# Patient Record
Sex: Male | Born: 1975 | Hispanic: Yes | Marital: Single | Smoking: Never smoker
Health system: Southern US, Community
[De-identification: ages and names within clinical notes are randomized; demographics above are authoritative.]

## PROBLEM LIST (undated history)

## (undated) DIAGNOSIS — Z789 Other specified health status: Secondary | ICD-10-CM

## (undated) DIAGNOSIS — J302 Other seasonal allergic rhinitis: Secondary | ICD-10-CM

## (undated) HISTORY — PX: NO PAST SURGERIES: SHX2092

---

## 2014-08-23 ENCOUNTER — Other Ambulatory Visit (HOSPITAL_COMMUNITY): Payer: Self-pay

## 2014-08-23 NOTE — H&P (Signed)
  Martin LevineDaniel Thompson is an 39 y.o. male.    Chief Complaint: left shoulder pain  HPI: Pt is a 39 y.o. male complaining of left shoulder pain for 2 weeks. Pt fell while working about 12 feet onto left shoulder and ribs. Pain had continually increased since the beginning. X-rays in the clinic show grade III AC separation. Pt has tried various conservative treatments which have failed to alleviate their symptoms, including sling and rest. Various options are discussed with the patient. Risks, benefits and expectations were discussed with the patient. Patient understand the risks, benefits and expectations and wishes to proceed with surgery.   PCP:  No primary care provider on file.  D/C Plans:  Home   PMH: No past medical history on file.  PSH: No past surgical history on file.  Social History:  has no tobacco, alcohol, and drug history on file.  Allergies:  Allergies not on file  Medications: No current facility-administered medications for this encounter.   No current outpatient prescriptions on file.    No results found for this or any previous visit (from the past 48 hour(s)). No results found.  ROS: Pain with rom of the left upper extremity  Physical Exam:  Alert and oriented 39 y.o. male in no acute distress Cranial nerves 2-12 intact Cervical spine: full rom with no tenderness, nv intact distally Chest: active breath sounds bilaterally, no wheeze rhonchi or rales Heart: regular rate and rhythm, no murmur Abd: non tender non distended with active bowel sounds Hip is stable with rom  Left shoulder with obvious deformity and left AC separation nv intact distally No rashes or edema  Assessment/Plan Assessment: left shoulder AC separation  Plan: Patient will undergo a left shoulder coracoclavicular reconstruction by Dr. Ranell [facility]Norris [facility]. Risks benefits and expectations were discussed with the patient. Patient understand risks, benefits and expectations and wishes to  proceed.

## 2014-08-25 ENCOUNTER — Encounter (HOSPITAL_COMMUNITY): Payer: Self-pay | Admitting: *Deleted

## 2014-08-25 MED ORDER — CEFAZOLIN SODIUM-DEXTROSE 2-3 GM-% IV SOLR
2.0000 g | INTRAVENOUS | Status: AC
  Administered 2014-08-26: 2 g via INTRAVENOUS
  Filled 2014-08-25: qty 50

## 2014-08-25 NOTE — Progress Notes (Signed)
Spoke with pt's friend Martin RilingCris Thompson, who speaks [place]. Pt requested that she be called and given pre-op instructions. She was able to verify his allergies, meds and some of his medical/surgical history. Will need to ask more questions to pt day of surgery. Interpreter has been requested.

## 2014-08-26 ENCOUNTER — Observation Stay (HOSPITAL_COMMUNITY): Payer: Self-pay

## 2014-08-26 ENCOUNTER — Other Ambulatory Visit (HOSPITAL_COMMUNITY): Payer: Self-pay | Admitting: Physician Assistant

## 2014-08-26 ENCOUNTER — Encounter (HOSPITAL_COMMUNITY): Payer: Self-pay | Admitting: *Deleted

## 2014-08-26 ENCOUNTER — Ambulatory Visit (HOSPITAL_COMMUNITY): Payer: Self-pay | Admitting: Anesthesiology

## 2014-08-26 ENCOUNTER — Encounter (HOSPITAL_COMMUNITY)
Admission: RE | Disposition: A | Discharge status: Home / Self Care | Payer: Self-pay | Source: Ambulatory Visit | Attending: Orthopedic Surgery

## 2014-08-26 ENCOUNTER — Observation Stay (HOSPITAL_COMMUNITY)
Admission: RE | Admit: 2014-08-26 | Discharge: 2014-08-27 | Disposition: A | Discharge status: Home / Self Care | Payer: Self-pay | Source: Ambulatory Visit | Attending: Orthopedic Surgery | Admitting: Orthopedic Surgery

## 2014-08-26 DIAGNOSIS — M19012 Primary osteoarthritis, left shoulder: Secondary | ICD-10-CM | POA: Insufficient documentation

## 2014-08-26 DIAGNOSIS — S43432A Superior glenoid labrum lesion of left shoulder, initial encounter: Secondary | ICD-10-CM | POA: Insufficient documentation

## 2014-08-26 DIAGNOSIS — W1789XA Other fall from one level to another, initial encounter: Secondary | ICD-10-CM | POA: Insufficient documentation

## 2014-08-26 DIAGNOSIS — S43102A Unspecified dislocation of left acromioclavicular joint, initial encounter: Principal | ICD-10-CM | POA: Insufficient documentation

## 2014-08-26 DIAGNOSIS — S46119A Strain of muscle, fascia and tendon of long head of biceps, unspecified arm, initial encounter: Secondary | ICD-10-CM | POA: Diagnosis present

## 2014-08-26 DIAGNOSIS — Y929 Unspecified place or not applicable: Secondary | ICD-10-CM | POA: Insufficient documentation

## 2014-08-26 DIAGNOSIS — S4382XA Sprain of other specified parts of left shoulder girdle, initial encounter: Secondary | ICD-10-CM

## 2014-08-26 HISTORY — PX: RECONSTRUCTION OF CORACOCLAVICULAR LIGAMENT: SHX6045

## 2014-08-26 HISTORY — DX: Other specified health status: Z78.9

## 2014-08-26 HISTORY — DX: Other seasonal allergic rhinitis: J30.2

## 2014-08-26 SURGERY — RECONSTRUCTION, LIGAMENT, CORACOCLAVICULAR
Anesthesia: General | Site: Shoulder | Laterality: Left

## 2014-08-26 MED ORDER — ONDANSETRON HCL 4 MG/2ML IJ SOLN
INTRAMUSCULAR | Status: DC | PRN
  Administered 2014-08-26: 4 mg via INTRAVENOUS

## 2014-08-26 MED ORDER — METOCLOPRAMIDE HCL 5 MG/ML IJ SOLN: 5.0000 mg | Freq: Three times a day (TID) | INTRAMUSCULAR | Status: DC | PRN

## 2014-08-26 MED ORDER — DEXAMETHASONE SODIUM PHOSPHATE 4 MG/ML IJ SOLN
INTRAMUSCULAR | Status: DC | PRN
  Administered 2014-08-26: 8 mg via INTRAVENOUS

## 2014-08-26 MED ORDER — OXYCODONE-ACETAMINOPHEN 5-325 MG PO TABS: 1.0000 | ORAL_TABLET | ORAL | Status: AC | PRN

## 2014-08-26 MED ORDER — LIDOCAINE HCL (CARDIAC) 20 MG/ML IV SOLN
INTRAVENOUS | Status: DC | PRN
  Administered 2014-08-26: 100 mg via INTRAVENOUS

## 2014-08-26 MED ORDER — ONDANSETRON HCL 4 MG PO TABS: 4.0000 mg | ORAL_TABLET | Freq: Four times a day (QID) | ORAL | Status: DC | PRN

## 2014-08-26 MED ORDER — FENTANYL CITRATE 0.05 MG/ML IJ SOLN
INTRAMUSCULAR | Status: AC
  Filled 2014-08-26: qty 5

## 2014-08-26 MED ORDER — FENTANYL CITRATE 0.05 MG/ML IJ SOLN
INTRAMUSCULAR | Status: DC | PRN
  Administered 2014-08-26: 100 ug via INTRAVENOUS
  Administered 2014-08-26 (×3): 50 ug via INTRAVENOUS

## 2014-08-26 MED ORDER — BUPIVACAINE-EPINEPHRINE (PF) 0.25% -1:200000 IJ SOLN
INTRAMUSCULAR | Status: AC
  Filled 2014-08-26: qty 30

## 2014-08-26 MED ORDER — BISACODYL 10 MG RE SUPP: 10.0000 mg | Freq: Every day | RECTAL | Status: DC | PRN

## 2014-08-26 MED ORDER — DEXAMETHASONE SODIUM PHOSPHATE 4 MG/ML IJ SOLN
INTRAMUSCULAR | Status: AC
  Filled 2014-08-26: qty 2

## 2014-08-26 MED ORDER — LIDOCAINE HCL (CARDIAC) 20 MG/ML IV SOLN
INTRAVENOUS | Status: AC
  Filled 2014-08-26: qty 5

## 2014-08-26 MED ORDER — ACETAMINOPHEN 325 MG PO TABS: 650.0000 mg | ORAL_TABLET | Freq: Four times a day (QID) | ORAL | Status: DC | PRN

## 2014-08-26 MED ORDER — METHOCARBAMOL 500 MG PO TABS
500.0000 mg | ORAL_TABLET | Freq: Four times a day (QID) | ORAL | Status: DC | PRN
  Administered 2014-08-27: 500 mg via ORAL
  Filled 2014-08-26 (×2): qty 1

## 2014-08-26 MED ORDER — NEOSTIGMINE METHYLSULFATE 10 MG/10ML IV SOLN
INTRAVENOUS | Status: DC | PRN
Start: 2014-08-26 — End: 2014-08-26
  Administered 2014-08-26: 4 mg via INTRAVENOUS

## 2014-08-26 MED ORDER — PHENYLEPHRINE HCL 10 MG/ML IJ SOLN
INTRAMUSCULAR | Status: DC | PRN
  Administered 2014-08-26: 80 ug via INTRAVENOUS
  Administered 2014-08-26: 120 ug via INTRAVENOUS
  Administered 2014-08-26 (×5): 80 ug via INTRAVENOUS

## 2014-08-26 MED ORDER — FENTANYL CITRATE 0.05 MG/ML IJ SOLN
INTRAMUSCULAR | Status: AC
  Administered 2014-08-26: 50 ug
  Filled 2014-08-26: qty 2

## 2014-08-26 MED ORDER — BUPIVACAINE-EPINEPHRINE (PF) 0.5% -1:200000 IJ SOLN
INTRAMUSCULAR | Status: DC | PRN
  Administered 2014-08-26: 20 mL via PERINEURAL

## 2014-08-26 MED ORDER — FENTANYL CITRATE 0.05 MG/ML IJ SOLN
INTRAMUSCULAR | Status: AC
  Filled 2014-08-26: qty 2

## 2014-08-26 MED ORDER — LACTATED RINGERS IV SOLN
INTRAVENOUS | Status: DC | PRN
  Administered 2014-08-26 (×2): via INTRAVENOUS

## 2014-08-26 MED ORDER — PHENYLEPHRINE 40 MCG/ML (10ML) SYRINGE FOR IV PUSH (FOR BLOOD PRESSURE SUPPORT)
PREFILLED_SYRINGE | INTRAVENOUS | Status: AC
  Filled 2014-08-26: qty 20

## 2014-08-26 MED ORDER — PHENOL 1.4 % MT LIQD
1.0000 | OROMUCOSAL | Status: DC | PRN
Start: 2014-08-26 — End: 2014-08-27

## 2014-08-26 MED ORDER — PROMETHAZINE HCL 25 MG/ML IJ SOLN: 6.2500 mg | INTRAMUSCULAR | Status: DC | PRN

## 2014-08-26 MED ORDER — NEOSTIGMINE METHYLSULFATE 10 MG/10ML IV SOLN
INTRAVENOUS | Status: AC
  Filled 2014-08-26: qty 1

## 2014-08-26 MED ORDER — FENTANYL CITRATE 0.05 MG/ML IJ SOLN
25.0000 ug | INTRAMUSCULAR | Status: DC | PRN
  Administered 2014-08-26 (×2): 50 ug via INTRAVENOUS

## 2014-08-26 MED ORDER — BUPIVACAINE-EPINEPHRINE 0.25% -1:200000 IJ SOLN
INTRAMUSCULAR | Status: DC | PRN
  Administered 2014-08-26: 8 mL

## 2014-08-26 MED ORDER — HYDROMORPHONE HCL 1 MG/ML IJ SOLN: 1.0000 mg | INTRAMUSCULAR | Status: DC | PRN

## 2014-08-26 MED ORDER — MENTHOL 3 MG MT LOZG: 1.0000 | LOZENGE | OROMUCOSAL | Status: DC | PRN

## 2014-08-26 MED ORDER — ROCURONIUM BROMIDE 50 MG/5ML IV SOLN
INTRAVENOUS | Status: AC
  Filled 2014-08-26: qty 1

## 2014-08-26 MED ORDER — GLYCOPYRROLATE 0.2 MG/ML IJ SOLN
INTRAMUSCULAR | Status: AC
  Filled 2014-08-26: qty 4

## 2014-08-26 MED ORDER — MIDAZOLAM HCL 5 MG/5ML IJ SOLN
INTRAMUSCULAR | Status: DC | PRN
  Administered 2014-08-26 (×2): 1 mg via INTRAVENOUS

## 2014-08-26 MED ORDER — MIDAZOLAM HCL 2 MG/2ML IJ SOLN
INTRAMUSCULAR | Status: AC
Start: 2014-08-26 — End: 2014-08-26
  Administered 2014-08-26: 1 mg
  Filled 2014-08-26: qty 2

## 2014-08-26 MED ORDER — POLYETHYLENE GLYCOL 3350 17 G PO PACK: 17.0000 g | PACK | Freq: Every day | ORAL | Status: DC | PRN

## 2014-08-26 MED ORDER — GLYCOPYRROLATE 0.2 MG/ML IJ SOLN
INTRAMUSCULAR | Status: AC
  Filled 2014-08-26: qty 2

## 2014-08-26 MED ORDER — PHENYLEPHRINE HCL 10 MG/ML IJ SOLN
10.0000 mg | INTRAVENOUS | Status: DC | PRN
  Administered 2014-08-26: 40 ug/min via INTRAVENOUS

## 2014-08-26 MED ORDER — ROCURONIUM BROMIDE 100 MG/10ML IV SOLN
INTRAVENOUS | Status: DC | PRN
  Administered 2014-08-26: 10 mg via INTRAVENOUS
  Administered 2014-08-26: 40 mg via INTRAVENOUS

## 2014-08-26 MED ORDER — ONDANSETRON HCL 4 MG/2ML IJ SOLN
INTRAMUSCULAR | Status: AC
  Filled 2014-08-26: qty 2

## 2014-08-26 MED ORDER — CEFAZOLIN SODIUM-DEXTROSE 2-3 GM-% IV SOLR
2.0000 g | Freq: Four times a day (QID) | INTRAVENOUS | Status: AC
  Administered 2014-08-26 – 2014-08-27 (×3): 2 g via INTRAVENOUS
  Filled 2014-08-26 (×3): qty 50

## 2014-08-26 MED ORDER — PROPOFOL 10 MG/ML IV BOLUS
INTRAVENOUS | Status: AC
  Filled 2014-08-26: qty 20

## 2014-08-26 MED ORDER — MIDAZOLAM HCL 2 MG/2ML IJ SOLN
INTRAMUSCULAR | Status: AC
  Filled 2014-08-26: qty 2

## 2014-08-26 MED ORDER — LACTATED RINGERS IV SOLN
INTRAVENOUS | Status: DC
  Administered 2014-08-26: 15:00:00 via INTRAVENOUS

## 2014-08-26 MED ORDER — GLYCOPYRROLATE 0.2 MG/ML IJ SOLN
INTRAMUSCULAR | Status: DC | PRN
  Administered 2014-08-26: 0.4 mg via INTRAVENOUS

## 2014-08-26 MED ORDER — MEPERIDINE HCL 25 MG/ML IJ SOLN: 6.2500 mg | INTRAMUSCULAR | Status: DC | PRN

## 2014-08-26 MED ORDER — METOCLOPRAMIDE HCL 10 MG PO TABS: 5.0000 mg | ORAL_TABLET | Freq: Three times a day (TID) | ORAL | Status: DC | PRN

## 2014-08-26 MED ORDER — SODIUM CHLORIDE 0.9 % IV SOLN
INTRAVENOUS | Status: DC
  Administered 2014-08-27: 07:00:00 via INTRAVENOUS

## 2014-08-26 MED ORDER — DEXTROSE 5 % IV SOLN
500.0000 mg | Freq: Four times a day (QID) | INTRAVENOUS | Status: DC | PRN
  Filled 2014-08-26: qty 5

## 2014-08-26 MED ORDER — ACETAMINOPHEN 650 MG RE SUPP: 650.0000 mg | Freq: Four times a day (QID) | RECTAL | Status: DC | PRN

## 2014-08-26 MED ORDER — OXYCODONE-ACETAMINOPHEN 5-325 MG PO TABS
1.0000 | ORAL_TABLET | ORAL | Status: DC | PRN
  Administered 2014-08-26 – 2014-08-27 (×3): 2 via ORAL
  Filled 2014-08-26 (×3): qty 2

## 2014-08-26 MED ORDER — ONDANSETRON HCL 4 MG/2ML IJ SOLN: 4.0000 mg | Freq: Four times a day (QID) | INTRAMUSCULAR | Status: DC | PRN

## 2014-08-26 MED ORDER — PROPOFOL 10 MG/ML IV BOLUS
INTRAVENOUS | Status: DC | PRN
  Administered 2014-08-26: 200 mg via INTRAVENOUS

## 2014-08-26 MED ORDER — METHOCARBAMOL 500 MG PO TABS: 500.0000 mg | ORAL_TABLET | Freq: Three times a day (TID) | ORAL | Status: AC | PRN

## 2014-08-26 SURGICAL SUPPLY — 64 items
ANCHOR JUGGERKNOT W/DRL 2/1.45 (Orthopedic Implant) ×3 IMPLANT
BIT DRILL 7/64X5 DISP (BIT) IMPLANT
BLADE AVERAGE 25MMX9MM (BLADE) ×1
BLADE AVERAGE 25X9 (BLADE) ×2 IMPLANT
BLADE SAW SGTL 81X20 HD (BLADE) ×3 IMPLANT
BNDG COHESIVE 4X5 TAN STRL (GAUZE/BANDAGES/DRESSINGS) ×3 IMPLANT
CLEANER TIP ELECTROSURG 2X2 (MISCELLANEOUS) ×3 IMPLANT
CLOSURE STERI-STRIP 1/2X4 (GAUZE/BANDAGES/DRESSINGS) ×1
CLOSURE WOUND 1/2 X4 (GAUZE/BANDAGES/DRESSINGS) ×1
CLSR STERI-STRIP ANTIMIC 1/2X4 (GAUZE/BANDAGES/DRESSINGS) ×2 IMPLANT
COVER SURGICAL LIGHT HANDLE (MISCELLANEOUS) ×3 IMPLANT
DECANTER SPIKE VIAL GLASS SM (MISCELLANEOUS) IMPLANT
DRAPE INCISE IOBAN 66X45 STRL (DRAPES) ×3 IMPLANT
DRAPE ORTHO SPLIT 77X108 STRL (DRAPES) ×2
DRAPE SURG ORHT 6 SPLT 77X108 (DRAPES) ×1 IMPLANT
DRAPE U-SHAPE 47X51 STRL (DRAPES) ×6 IMPLANT
DRSG ADAPTIC 3X8 NADH LF (GAUZE/BANDAGES/DRESSINGS) ×3 IMPLANT
DRSG EMULSION OIL 3X3 NADH (GAUZE/BANDAGES/DRESSINGS) ×3 IMPLANT
DRSG PAD ABDOMINAL 8X10 ST (GAUZE/BANDAGES/DRESSINGS) ×3 IMPLANT
DURAPREP 26ML APPLICATOR (WOUND CARE) ×3 IMPLANT
ELECT REM PT RETURN 9FT ADLT (ELECTROSURGICAL) ×3
ELECTRODE REM PT RTRN 9FT ADLT (ELECTROSURGICAL) ×1 IMPLANT
FACESHIELD WRAPAROUND (MASK) ×6 IMPLANT
FIBERSTICK 2 (SUTURE) ×6 IMPLANT
GAUZE SPONGE 4X4 12PLY STRL (GAUZE/BANDAGES/DRESSINGS) ×3 IMPLANT
GLOVE BIOGEL PI ORTHO PRO 7.5 (GLOVE) ×2
GLOVE BIOGEL PI ORTHO PRO SZ8 (GLOVE) ×2
GLOVE ORTHO TXT STRL SZ7.5 (GLOVE) ×3 IMPLANT
GLOVE PI ORTHO PRO STRL 7.5 (GLOVE) ×1 IMPLANT
GLOVE PI ORTHO PRO STRL SZ8 (GLOVE) ×1 IMPLANT
GLOVE SURG ORTHO 8.5 STRL (GLOVE) ×3 IMPLANT
GOWN STRL REUS W/ TWL LRG LVL3 (GOWN DISPOSABLE) ×1 IMPLANT
GOWN STRL REUS W/ TWL XL LVL3 (GOWN DISPOSABLE) ×2 IMPLANT
GOWN STRL REUS W/TWL LRG LVL3 (GOWN DISPOSABLE) ×2
GOWN STRL REUS W/TWL XL LVL3 (GOWN DISPOSABLE) ×4
KIT AC JOINT DISP (KITS) ×3 IMPLANT
KIT BASIN OR (CUSTOM PROCEDURE TRAY) ×3 IMPLANT
KIT BIO-TENODESIS 3X8 DISP (MISCELLANEOUS) ×4
KIT INSRT BABSR STRL DISP BTN (MISCELLANEOUS) ×2 IMPLANT
KIT ROOM TURNOVER OR (KITS) ×3 IMPLANT
MANIFOLD NEPTUNE II (INSTRUMENTS) ×3 IMPLANT
NDL SUT .5 MAYO 1.404X.05X (NEEDLE) ×1 IMPLANT
NDL SUT 6 .5 CRC .975X.05 MAYO (NEEDLE) ×1 IMPLANT
NEEDLE HYPO 25GX1X1/2 BEV (NEEDLE) ×3 IMPLANT
NEEDLE MAYO TAPER (NEEDLE) ×4
NS IRRIG 1000ML POUR BTL (IV SOLUTION) ×3 IMPLANT
PACK ORTHO EXTREMITY (CUSTOM PROCEDURE TRAY) ×3 IMPLANT
PACK SHOULDER (CUSTOM PROCEDURE TRAY) ×3 IMPLANT
PAD ARMBOARD 7.5X6 YLW CONV (MISCELLANEOUS) ×6 IMPLANT
PASSER SUT SWANSON 36MM LOOP (INSTRUMENTS) ×3 IMPLANT
SPONGE LAP 4X18 X RAY DECT (DISPOSABLE) ×6 IMPLANT
STOCKINETTE IMPERVIOUS 9X36 MD (GAUZE/BANDAGES/DRESSINGS) ×3 IMPLANT
STRIP CLOSURE SKIN 1/2X4 (GAUZE/BANDAGES/DRESSINGS) ×2 IMPLANT
SUT MNCRL AB 3-0 PS2 18 (SUTURE) ×3 IMPLANT
SUT VIC AB 2-0 CT1 27 (SUTURE) ×2
SUT VIC AB 2-0 CT1 TAPERPNT 27 (SUTURE) ×1 IMPLANT
SYR CONTROL 10ML LL (SYRINGE) ×3 IMPLANT
TENDON GRACILIS FROZEN (Bone Implant) ×3 IMPLANT
TENDON GRACILIS FROZEN 230-320 (Bone Implant) ×1 IMPLANT
TOWEL OR 17X24 6PK STRL BLUE (TOWEL DISPOSABLE) ×3 IMPLANT
TOWEL OR 17X26 10 PK STRL BLUE (TOWEL DISPOSABLE) ×3 IMPLANT
WATER STERILE IRR 1000ML POUR (IV SOLUTION) ×3 IMPLANT
YANKAUER SUCT BULB TIP NO VENT (SUCTIONS) ×3 IMPLANT
ZIPLOOP AC JOINT REPAIR (Orthopedic Implant) ×6 IMPLANT

## 2014-08-26 NOTE — Progress Notes (Signed)
Interpreter at bedside, arrived with pt to the hospital.

## 2014-08-26 NOTE — Discharge Instructions (Signed)
Keep the sling on while up walking around.  May remove the sling while seated and rest the arm on a pillow on the lap.   Do exercises every hour for an hour.  Lap slides and rotation exercises.  No lifting pushing or pulling at all!  Keep the incisions covered and clean and dry for one week, then ok to get the wounds wet in the shower.  Call for follow up appointment with Dr Ranell PatrickNorris in two weeks  [phone]

## 2014-08-26 NOTE — Interval H&P Note (Signed)
History and Physical Interval Note:  08/26/2014 2:51 PM  Martin LevineDaniel Ozturk  has presented today for surgery, with the diagnosis of left shoulder separation  The various methods of treatment have been discussed with the patient and family. After consideration of risks, benefits and other options for treatment, the patient has consented to  Procedure(s): RECONSTRUCTION OF LEFT SHOULDER CORACOCLAVICULAR  (Left) as a surgical intervention .  The patient's history has been reviewed, patient examined, no change in status, stable for surgery.  I have reviewed the patient's chart and labs.  Questions were answered to the patient's satisfaction.     Excell Neyland,STEVEN R

## 2014-08-26 NOTE — Anesthesia Postprocedure Evaluation (Signed)
  Anesthesia Post-op Note  Patient: Martin Thompson  Procedure(s) Performed: Procedure(s): Left shoulder arthroscopy coracoclavicular reconstruction with allograft  (Left)  Patient Location: PACU  Anesthesia Type:General and GA combined with regional for post-op pain  Level of Consciousness: awake, oriented, sedated and patient cooperative  Airway and Oxygen Therapy: Patient Spontanous Breathing  Post-op Pain: none  Post-op Assessment: Post-op Vital signs reviewed, Patient's Cardiovascular Status Stable, Respiratory Function Stable, Patent Airway, No signs of Nausea or vomiting and Pain level controlled  Post-op Vital Signs: stable  Last Vitals:  Filed Vitals:   08/26/14 2053  BP: 133/68  Pulse: 92  Temp: 37.2 C  Resp: 20    Complications: No apparent anesthesia complications

## 2014-08-26 NOTE — Progress Notes (Signed)
CRNA at bedside.

## 2014-08-26 NOTE — Anesthesia Preprocedure Evaluation (Addendum)
Anesthesia Evaluation  Patient identified by MRN, date of birth, ID band Patient awake    Airway Mallampati: II   Neck ROM: Full    Dental  (+) Teeth Intact, Dental Advisory Given   Pulmonary neg pulmonary ROS,  breath sounds clear to auscultation        Cardiovascular negative cardio ROS      Neuro/Psych negative neurological ROS     GI/Hepatic negative GI ROS, Neg liver ROS,   Endo/Other  negative endocrine ROS  Renal/GU negative Renal ROS     Musculoskeletal   Abdominal (+) + obese,   Peds  Hematology   Anesthesia Other Findings   Reproductive/Obstetrics                            Anesthesia Physical Anesthesia Plan  ASA: I  Anesthesia Plan: General   Post-op Pain Management: MAC Combined w/ Regional for Post-op pain   Induction: Intravenous  Airway Management Planned: Oral ETT  Additional Equipment:   Intra-op Plan:   Post-operative Plan:   Informed Consent: I have reviewed the patients History and Physical, chart, labs and discussed the procedure including the risks, benefits and alternatives for the proposed anesthesia with the patient or authorized representative who has indicated his/her understanding and acceptance.     Plan Discussed with:   Anesthesia Plan Comments:         Anesthesia Quick Evaluation

## 2014-08-26 NOTE — Anesthesia Procedure Notes (Signed)
Anesthesia Regional Block:  Interscalene brachial plexus block  Pre-Anesthetic Checklist: ,, timeout performed, Correct Patient, Correct Site, Correct Laterality, Correct Procedure, Correct Position, site marked, Risks and benefits discussed,  Surgical consent,  Pre-op evaluation,  At surgeon's request and post-op pain management  Laterality: Left and Upper  Prep: Maximum Sterile Barrier Precautions used and chloraprep       Needles:  Injection technique: Single-shot  Needle Type: Echogenic Stimulator Needle     Needle Length: 10cm 10 cm Needle Gauge: 21 and 21 G    Additional Needles:  Procedures: ultrasound guided (picture in chart) and nerve stimulator Interscalene brachial plexus block  Nerve Stimulator or Paresthesia:  Response: 0.4 mA,   Additional Responses:   Narrative:  Start time: 08/26/2014 3:06 PM End time: 08/26/2014 3:16 PM Injection made incrementally with aspirations every 5 mL.  Performed by: Personally  Anesthesiologist: Sebastian AcheMANNY, Yosgar Demirjian  Additional Notes: L IS Block with 20 ml .5% marcaine with epi, multiple asp, talked to patient throughout, no complications, US and stimulator

## 2014-08-26 NOTE — Brief Op Note (Signed)
08/26/2014  7:27 PM  PATIENT:  Erline LevineDaniel Radich  39 y.o. male  PRE-OPERATIVE DIAGNOSIS:  left shoulder separation, grade 5  POST-OPERATIVE DIAGNOSIS:  left shoulder separation, grade 5 with AC joint arthritis, and SLAP tear and biceps tear, partial subscap tear  PROCEDURE:  Procedure(s): Left shoulder arthroscopy coracoclavicular reconstruction with allograft  (Left) extensive intra-articular debridement of SLAP tear, biceps tenotomy, debridement of partial thickness subscap tear, A-SAD, open biceps tenodesis,   SURGEON:  Surgeon(s) and Role:    * Verlee RossettiSteven R Analiya Porco, MD - Primary  PHYSICIAN ASSISTANT:   ASSISTANTS: Thea Gisthomas B Dixon, PA-C   ANESTHESIA:   regional and general  EBL:     BLOOD ADMINISTERED:none  DRAINS: none   LOCAL MEDICATIONS USED:  MARCAINE     SPECIMEN:  No Specimen  DISPOSITION OF SPECIMEN:  N/A  COUNTS:  YES  TOURNIQUET:  * No tourniquets in log *  DICTATION: .Other Dictation: Dictation Number [id number]  PLAN OF CARE: Admit for overnight observation  PATIENT DISPOSITION:  PACU - hemodynamically stable.   Delay start of Pharmacological VTE agent (>24hrs) due to surgical blood loss or risk of bleeding: not applicable

## 2014-08-26 NOTE — Progress Notes (Signed)
Called and spoke with interpreter, translated to pt to come in now as Dr. Ranell PatrickNorris is running ahead of schedule, understanding verbalized.

## 2014-08-26 NOTE — Transfer of Care (Signed)
Immediate Anesthesia Transfer of Care Note  Patient: Martin LevineDaniel Thompson  Procedure(s) Performed: Procedure(s): Left shoulder arthroscopy coracoclavicular reconstruction with allograft  (Left)  Patient Location: PACU  Anesthesia Type:General  Level of Consciousness: sedated, patient cooperative and responds to stimulation  Airway & Oxygen Therapy: Patient Spontanous Breathing and Patient connected to nasal cannula oxygen  Post-op Assessment: Report given to RN, Post -op Vital signs reviewed and stable and Patient moving all extremities X 4  Post vital signs: Reviewed and stable  Last Vitals:  Filed Vitals:   08/26/14 1520  BP: 139/83  Pulse: 94  Temp:   Resp: 20    Complications: No apparent anesthesia complications

## 2014-08-27 ENCOUNTER — Encounter (HOSPITAL_COMMUNITY): Payer: Self-pay | Admitting: Orthopedic Surgery

## 2014-08-27 LAB — BASIC METABOLIC PANEL
ANION GAP: 11 (ref 5–15)
BUN: 13 mg/dL (ref 6–23)
CALCIUM: 9 mg/dL (ref 8.4–10.5)
CO2: 25 mmol/L (ref 19–32)
Chloride: 101 mmol/L (ref 96–112)
Creatinine, Ser: 0.85 mg/dL (ref 0.50–1.35)
GFR calc Af Amer: >90 mL/min (ref >90)
GFR calc non Af Amer: >90 mL/min (ref >90)
Glucose, Bld: 132 mg/dL — ABNORMAL HIGH (ref 70–99)
Potassium: 3.9 mmol/L (ref 3.5–5.1)
SODIUM: 137 mmol/L (ref 135–145)

## 2014-08-27 LAB — HEMOGLOBIN AND HEMATOCRIT, BLOOD
HEMATOCRIT: 39.9 % (ref 39.0–52.0)
Hemoglobin: 13.6 g/dL (ref 13.0–17.0)

## 2014-08-27 MED ORDER — INFLUENZA VAC SPLIT QUAD 0.5 ML IM SUSY: 0.5000 mL | PREFILLED_SYRINGE | INTRAMUSCULAR | Status: DC

## 2014-08-27 MED ORDER — INFLUENZA VAC SPLIT QUAD 0.5 ML IM SUSY
0.5000 mL | PREFILLED_SYRINGE | Freq: Once | INTRAMUSCULAR | Status: AC
  Administered 2014-08-27: 0.5 mL via INTRAMUSCULAR
  Filled 2014-08-27: qty 0.5

## 2014-08-27 NOTE — Evaluation (Signed)
Occupational Therapy Evaluation Patient Details Name: Martin LevineDaniel Rineer MRN: [id number] DOB: [birth year] Today's Date: 08/27/2014    History of Present Illness s/p reconstruction of L coracoclavicular ligament   Clinical Impression   Pt admitted with the above diagnoses and presents with below problem list. Pt will benefit from continued acute OT to address the below listed deficits and maximize independence with basic ADLs prior to d/c home with family. PTA pt was independent with ADLs. Currently pt at min guard to min A level with ADLs. ADL, sling, and exercise education provided to pt and family. OT to continue to see acutely and recommend outpatient OT after d/c.     Follow Up Recommendations  Supervision/Assistance - 24 hour;Outpatient OT    Equipment Recommendations  None recommended by OT    Recommendations for Other Services       Precautions / Restrictions Precautions Precautions: Shoulder Type of Shoulder Precautions: NWB, no AROM at L shoulder level Shoulder Interventions: Shoulder sling/immobilizer;Off for dressing/bathing/exercises Precaution Booklet Issued: Yes (comment) Precaution Comments: reviewed precautions Required Braces or Orthoses: Sling Restrictions Weight Bearing Restrictions: Yes LUE Weight Bearing: Non weight bearing      Mobility Bed Mobility Overal bed mobility: Needs Assistance Bed Mobility: Supine to Sit;Sit to Supine     Supine to sit: Min guard;HOB elevated Sit to supine: Min guard;HOB elevated   General bed mobility comments: no physical assist needed  Transfers Overall transfer level: Needs assistance   Transfers: Sit to/from Stand Sit to Stand: Min guard;From elevated surface         General transfer comment: min guard for safety; pt with some dizziness in standing    Balance Overall balance assessment: Needs assistance         Standing balance support: During functional activity Standing balance-Leahy Scale:  Fair Standing balance comment: some dizziness in standing, had recently received pain meds                            ADL Overall ADL's : Needs assistance/impaired Eating/Feeding: Set up;Sitting   Grooming: Set up;Sitting   Upper Body Bathing: Sitting;Minimal assitance   Lower Body Bathing: Minimal assistance;Sit to/from stand   Upper Body Dressing : Set up;Sitting   Lower Body Dressing: Min guard;Sit to/from stand   Toilet Transfer: Min guard;Ambulation;Regular Toilet;Grab bars Toilet Transfer Details (indicate cue type and reason): pt has sturdy sink on right side at home to stabilize self Toileting- Clothing Manipulation and Hygiene: Min guard;Sit to/from stand   Tub/ Shower Transfer: Min guard;Ambulation;Shower seat   Functional mobility during ADLs: Min guard General ADL Comments: Pt ambulated in-room at min guard level. ADL, exercise, and sling education provided to pt and family along with shoulder handout.      Vision     Perception     Praxis      Pertinent Vitals/Pain Pain Assessment: 0-10 Pain Score: 5  Pain Location: L shoulder area Pain Intervention(s): Limited activity within patient's tolerance;Monitored during session;Repositioned;Utilized relaxation techniques     Hand Dominance     Extremity/Trunk Assessment Upper Extremity Assessment Upper Extremity Assessment: LUE deficits/detail LUE Deficits / Details: s/p reconstruction of coracoclavicular ligament LUE: Unable to fully assess due to immobilization   Lower Extremity Assessment Lower Extremity Assessment: Overall WFL for tasks assessed   Cervical / Trunk Assessment Cervical / Trunk Assessment: Normal   Communication Communication Communication: Prefers language other than [place];Other (comment) (family interpreted)   Cognition Arousal/Alertness: Awake/alert Behavior During Therapy:  WFL for tasks assessed/performed Overall Cognitive Status: Within Functional Limits for tasks  assessed                     General Comments       Exercises Exercises: Shoulder     Shoulder Instructions Shoulder Instructions Donning/doffing shirt without moving shoulder:  (educated) Method for sponge bathing under operated UE:  (educated) Donning/doffing sling/immobilizer:  (educated) Correct positioning of sling/immobilizer:  (educated) Pendulum exercises (written home exercise program):  (educated on FF pendulum) ROM for elbow, wrist and digits of operated UE:  (educated) Sling wearing schedule (on at all times/off for ADL's):  (educated) Proper positioning of operated UE when showering:  (educated) Positioning of UE while sleeping:  (educated)    Home Living Family/patient expects to be discharged to:: Private residence Living Arrangements: Other relatives Available Help at Discharge: Family;Available 24 hours/day;Other (Comment) (nephew, brother, sister-in-law) Type of Home: House Home Access: Level entry     Home Layout: One level     Bathroom Shower/Tub: Chief Strategy Officer: Standard     Home Equipment: None          Prior Functioning/Environment Level of Independence: Independent             OT Diagnosis: Acute pain   OT Problem List: Impaired balance (sitting and/or standing);Decreased knowledge of use of DME or AE;Decreased knowledge of precautions;Pain;Impaired UE functional use   OT Treatment/Interventions: Self-care/ADL training;Therapeutic exercise;DME and/or AE instruction;Therapeutic activities;Patient/family education;Balance training    OT Goals(Current goals can be found in the care plan section) Acute Rehab OT Goals Patient Stated Goal: not stated OT Goal Formulation: With patient Time For Goal Achievement: 09/03/14 Potential to Achieve Goals: Good ADL Goals Pt Will Perform Upper Body Bathing: with modified independence;sitting Pt Will Perform Upper Body Dressing: with modified independence;sitting Pt Will  Transfer to Toilet: with modified independence;ambulating;regular height toilet;grab bars Pt Will Perform Toileting - Clothing Manipulation and hygiene: with modified independence;sit to/from stand Pt/caregiver will Perform Home Exercise Program: Left upper extremity;With written HEP provided;With Supervision  OT Frequency: Min 3X/week   Barriers to D/C:            Co-evaluation              End of Session Equipment Utilized During Treatment: Gait belt;Other (comment) (sling)  Activity Tolerance: Patient tolerated treatment well;Other (comment) (limited by dizziness) Patient left: in bed;with call bell/phone within reach;with family/visitor present   Time: 4098-1191 OT Time Calculation (min): 36 min Charges:  OT General Charges $OT Visit: 1 Procedure OT Evaluation $Initial OT Evaluation Tier I: 1 Procedure OT Treatments $Self Care/Home Management : 8-22 mins G-Codes: OT G-codes **NOT FOR INPATIENT CLASS** Functional Assessment Tool Used: clinical judgement Functional Limitation: Self care Self Care Current Status (Y7829): At least 1 percent but less than 20 percent impaired, limited or restricted Self Care Goal Status (F6213): At least 1 percent but less than 20 percent impaired, limited or restricted Self Care Discharge Status [phone]): At least 1 percent but less than 20 percent impaired, limited or restricted  Pilar Grammes 08/27/2014, 3:13 PM

## 2014-08-27 NOTE — Progress Notes (Signed)
Patient ID: Martin LevineDaniel Thompson, male   DOB: [birth year], 39 y.o.   MRN: [id number] Subjective: 1 Day Post-Op Procedure(s) (LRB): Left shoulder arthroscopy coracoclavicular reconstruction with allograft  (Left)    Patient reports pain as mild to moderate, seems to be fairly comfortable this am Spoke with family in room today  Objective:   VITALS:   Filed Vitals:   08/27/14 0516  BP: 117/62  Pulse: 96  Temp: 98.2 F (36.8 C)  Resp: 18    Neurovascular intact Incision: dressing C/D/I  LABS No results for input(s): HGB, HCT, WBC, PLT in the last 72 hours.  No results for input(s): NA, K, BUN, CREATININE, GLUCOSE in the last 72 hours.  No results for input(s): LABPT, INR in the last 72 hours.   Assessment/Plan: 1 Day Post-Op Procedure(s) (LRB): Left shoulder arthroscopy coracoclavicular reconstruction with allograft  (Left)   Advance diet Up with therapy Discharge home today if he does ok with pain and OOB activity

## 2014-08-27 NOTE — Progress Notes (Signed)
UR completed 

## 2014-08-27 NOTE — Op Note (Signed)
NAMMadilyn Fireman:  Rajewski, Jemery               ACCOUNT NO.:  [id number]  MEDICAL RECORD NO.:  [id number]  LOCATION:  [id number]                        FACILITY:  [facility]  PHYSICIAN:  Almedia BallsSteven R. Ranell PatrickNorris, M.D. DATE OF BIRTH:  February 15, 1976  DATE OF PROCEDURE:  08/26/2014 DATE OF DISCHARGE:                              OPERATIVE REPORT   PREOPERATIVE DIAGNOSIS:  Left grade 5 acromioclavicular separation.  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  PROCEDURE PERFORMED:  Left shoulder arthroscopy with extensive intra- articular debridement and torn superior labrum anterior to posterior. Arthroscopic biceps tenotomy, arthroscopic debridement of partial- thickness, subscapularis tear, arthroscopic subacromial decompression/bursectomy followed by open biceps tenodesis in the groove and open coracoclavicular ligament reconstruction with gracilis allograft.  ATTENDING SURGEON:  Almedia BallsSteven R. Ranell PatrickNorris, MD  ASSISTANT:  Donnie Coffinhomas B. Dixon, PA-C, who was scrubbed the entire procedure and necessary for satisfactory completion of surgery.  ANESTHESIA:  General anesthesia was used plus interscalene block.  ESTIMATED BLOOD LOSS:  100 mL.  FLUID REPLACEMENT:  1200 mL crystalloids.  INSTRUMENT COUNTS:  Correct.  COMPLICATIONS:  There were no complications.  ANTIBIOTICS:  Perioperative antibiotics were given.  INDICATIONS:  Patient is a 39 year old male who is left-hand dominant, he presents with a left shoulder grade 5 AC separation after a fall from 13 feet.  The [facility] and was referred to me for coracoclavicular reconstruction.  Due to the amount of trauma to the patient's  shoulder, I recommended that we proceed with a shoulder arthroscopy for inspection of intra-articular structures to make sure that he had not torn his rotator cuff or his labrum and  the patient agreed that procedure.  We also discussed allograft as an appropriate option for reconstructing CC ligaments.  This injury was about a month ago and that we were not going to be able to take advantage of the natural healing process given the subacute injury and we would need to reconstruct his ligaments.  Informed consent was obtained with the use of an interpreter.  Risks and benefits were discussed and the patient elected to proceed.  DESCRIPTION OF PROCEDURE:  After adequate level of anesthesia was achieved, patient positioned in modified beach-chair position.  Left shoulder correctly identified and sterilely prepped and draped in usual manner.  Time-out was called.  We initiated the arthroscopic portals, anterior, posterior, and lateral portals in the usual fashion with 11 blade scalpel and reduction of the joint using blunt obturators, identified significant tearing of the biceps, superior labral junction, the biceps was 80% torn, the labrum was torn from the 9 o'clock position to the 2 o'clock position in this left shoulder.  We performed a biceps tenotomy and labral debridement back to stable labral rim.  The posterior inferior labrum was intact.  The articular cartilage in the glenohumeral joint was normal, the subscapularis had a partial thickness tear about 20% of the upper centimeter of the subscapularis articular sided insertion was damaged and we used a suction shaver to remove the damaged tissue and then scuff up the subscap insertion point to try to encourage healing.  This was no where near 50% of the thickness of the tendon.  Rotator cuff was visualized from the anterior and posterior portals and was noted to be intact.  We placed the scope in subacromial space, performed a thorough bursectomy.  There was no signs of any subacromial spurs nor were there signs of any rotator cuff tears on the bursal surface, just some bursitis which were removed using  suction shaver.  Once we completed our inspection of the rotator cuff, we concluded the arthroscopic portion of the procedure.  We then made a saber incision overlying the distal clavicle, dissection down through subcutaneous tissues.  We identified deltotrapezial fascia, divided that line with distal clavicle.  Subperiosteal dissection of distal clavicle performed, this was clearly grade 5 AC separation.  We went ahead and identified then arthritic end of the clavicle which removed the terminal 2 mm using oscillating saw to prevent any type of AC joint symptomatology.  We also removed the articular disk which was damaged and some hypertrophic dorsal capsular tissue that was scarred in, we removed down so that this clavicle could be reduced.  Again, there had been quite a bit of healing that had gone on this area and scar tissue. Once we had that clavicle mobilized and readied for fixation, we made our distal incision down, it was basically the upper portion of the deltopectoral incision, starting at the coracoid and extending down about 4 cm.  Dissection down through subcutaneous tissues, we identified the deltopectoral interval and used blunt dissection to create that interval.  I was able to tease soft tissue off the top of the coracoid and then also identified the biceps groove.  We used a needle-tip Bovie to open up the bicipital groove, retrieved the tendon, prepared the floor of the biceps screw with needle-tip Bovie and a Therapist, nutritional and then placed a juggernaut anchor through the floor of the biceps groove and then brought that suture up in a whipstitch fashion through the biceps tendon getting a nice low-profile tenodesis, mid tension with the elbow at 90 degrees.  We oversewed the biceps tendon and the soft tissue over the top of that with 0 Vicryl suture x3 figure-of-eights incorporating some of the pectoralis as well for a solid repair, so once that biceps tenodesis was  complete and was nice and low profile, I palpated the rotator cuff and then felt good.  There was no evidence of any weak areas or partial thickness, interstitial tearing.  We next directed our attention towards the coracoid and completed our coracoid exposure.  We were able to get finger up under the conjoined tendon to be able to protect the structures deep to that.  Next, we went ahead and drilled a 4.5 mm hole in the clavicle over the top of the coracoid and also through the base of the coracoid centered medially and laterally. We were then able to pass the suture through the clavicle and retrieve it down and then pass again through the coracoid bringing it out underneath the coracoid.  We then used a passing suture to pass the Biomet suture loop, this basically is an EndoButton type device that is passed longitudinally and then flips and becomes  horizontally oriented and basically comes up to rest up underneath the coracoid process.  We went ahead and once we had that passed, I was able to palpate under the coracoid and feel that anchor was successfully deployed and was perpendicular to the direction of its passing and was up  against the hard cortical bone on the underside of the coracoid.  We visualized as well with C-arm that was draped into the field just to make sure I was deployed properly.  We then went ahead and passed a gracilis graft, it was 5 mm in diameter.  We whipstitched that with #2 FiberWire suture on both pins.  We passed that medial to the coracoid, and then passed it out laterally to the coracoid with nice soft tissue bridges to secure that graft and then passed it anterior and posterior of the clavicle. Once we had the graft passed, we went and reduced CC interval and AC interval and then using sequential tensioning, we were able to tension this overall a titanium button on the dorsal aspect of the clavicle, this provided excellent fixation.  So basically, we had  nice multi- stranded, nonabsorbable coracoclavicular fixation with titanium interference buttons on either end and then we went ahead and tensioned our gracilis tendon by passing the sutures that had been whipstitched around the clavicle again so basically we had a double loop of graft over the top of the clavicle and then tied the sutures to themselves gaining a very, very secure coracoclavicular reconstruction.  There was no abutment of bone there at the [facility] joint with the [facility] interval.  We thoroughly irrigated all wounds and closed in layers with 0 Vicryl, then 2-0 Vicryl and 4-0 Monocryl.  Steri-Strips applied followed by sterile dressing.  The patient tolerated procedure well.     Almedia Balls. Ranell Patrick, M.D.     SRN/MEDQ  D:  08/26/2014  T:  08/27/2014  Job:  [id number]

## 2014-08-30 NOTE — Discharge Summary (Signed)
Physician Discharge Summary   Patient ID: Martin Thompson MRN: [id number] DOB/AGE: [birth year] 39 y.o.  Admit date: 08/26/2014 Discharge date: 08/30/2014  Admission Diagnoses:  Active Problems:   Labral tear of long head of biceps tendon   Discharge Diagnoses:  Same   Surgeries: Procedure(s): Left shoulder arthroscopy coracoclavicular reconstruction with allograft  on 08/26/2014   Consultants: none  Discharged Condition: Stable  Hospital Course: Martin Thompson is an 39 y.o. male who was admitted 08/26/2014 with a chief complaint of No chief complaint on file. , and found to have a diagnosis of <principal problem not specified>.  They were brought to the operating room on 08/26/2014 and underwent the above named procedures.    The patient had an uncomplicated hospital course and was stable for discharge.  Recent vital signs:  Filed Vitals:   08/27/14 1326  BP: 124/75  Pulse: 100  Temp: 98.8 F (37.1 C)  Resp: 18    Recent laboratory studies:  Results for orders placed or performed during the hospital encounter of 08/26/14  Hemoglobin and hematocrit, blood  Result Value Ref Range   Hemoglobin 13.6 13.0 - 17.0 g/dL   HCT 46.939.9 62.939.0 - 52.852.0 %  Basic metabolic panel  Result Value Ref Range   Sodium 137 135 - 145 mmol/L   Potassium 3.9 3.5 - 5.1 mmol/L   Chloride 101 96 - 112 mmol/L   CO2 25 19 - 32 mmol/L   Glucose, Bld 132 (H) 70 - 99 mg/dL   BUN 13 6 - 23 mg/dL   Creatinine, Ser 4.130.85 0.50 - 1.35 mg/dL   Calcium 9.0 8.4 - 24.410.5 mg/dL   GFR calc non Af Amer >90 >90 mL/min   GFR calc Af Amer >90 >90 mL/min   Anion gap 11 5 - 15    Discharge Medications:     Medication List    STOP taking these medications        HYDROcodone-acetaminophen 5-325 MG per tablet  Commonly known as:  NORCO/VICODIN      TAKE these medications        methocarbamol 500 MG tablet  Commonly known as:  ROBAXIN  Take 1 tablet (500 mg total) by mouth 3 (three) times daily as needed.     oxyCODONE-acetaminophen 5-325 MG per tablet  Commonly known as:  ROXICET  Take 1-2 tablets by mouth every 4 (four) hours as needed for severe pain.        Diagnostic Studies: Dg Shoulder Left  08/26/2014   CLINICAL DATA:  S/p shoulder and biceps repair.  EXAM: LEFT SHOULDER - 2+ VIEW  COMPARISON:  08/09/2014  FINDINGS: AC joint is now normally aligned. A tubular lucent defect crosses the distal clavicle with an overlying metal button consistent with placement of a tendon graft from the clavicle to the coracoid process.  There is no evidence of an operative complication.  IMPRESSION: Single image following operative reduction of 1/3 degree AC joint separation. AC joint well aligned following surgical reduction.   Electronically Signed   By: Amie Portlandavid  Ormond M.D.   On: 08/26/2014 22:57    Disposition: 01-Home or Self Care      Discharge Instructions    Call MD / Call 911    Complete by:  As directed   If you experience chest pain or shortness of breath, CALL 911 and be transported to the hospital emergency room.  If you develope a fever above 101 F, pus (white drainage) or increased drainage or redness at the wound,  or calf pain, call your surgeon's office.     Constipation Prevention    Complete by:  As directed   Drink plenty of fluids.  Prune juice may be helpful.  You may use a stool softener, such as Colace (over the counter) 100 mg twice a day.  Use MiraLax (over the counter) for constipation as needed.     Diet - low sodium heart healthy    Complete by:  As directed      Discharge instructions    Complete by:  As directed   As directed by Dr. Ranell Patrick Keep wounds dry and covered No pushing or lifting with left arm     Driving restrictions    Complete by:  As directed   No driving until cleared by Dr. Ranell Patrick     Increase activity slowly as tolerated    Complete by:  As directed      Lifting restrictions    Complete by:  As directed   No lifting with left arm until cleared by Dr.  Ranell Patrick           Follow-up Information    Follow up with Verlee Rossetti, MD. Call in 2 weeks.   Specialty:  Orthopedic Surgery   Why:  [phone]   Contact information:   [address] [place] [address] [phone]        Signed: Thea Gist 08/30/2014, 4:47 PM

## 2014-08-31 ENCOUNTER — Encounter (HOSPITAL_COMMUNITY): Payer: Self-pay | Admitting: Orthopedic Surgery

## 2014-09-01 NOTE — Addendum Note (Signed)
Addendum  created 09/01/14 1556 by Sebastian Acheheodore Jayr Lupercio, MD   Modules edited: Anesthesia Attestations

## 2015-12-09 IMAGING — CR DG RIBS W/ CHEST 3+V*L*
3 series · 3 of 3 positions shown · non-contrast
Comparison: None.

CLINICAL DATA: Status post fall from a 12 foot height working on a
scaffold 6 days ago. The patient struck is left site and has
persistent pain in the left ribcage and left shoulder. Initial visit

EXAM:
LEFT RIBS AND CHEST - 3+ VIEW

[PA]
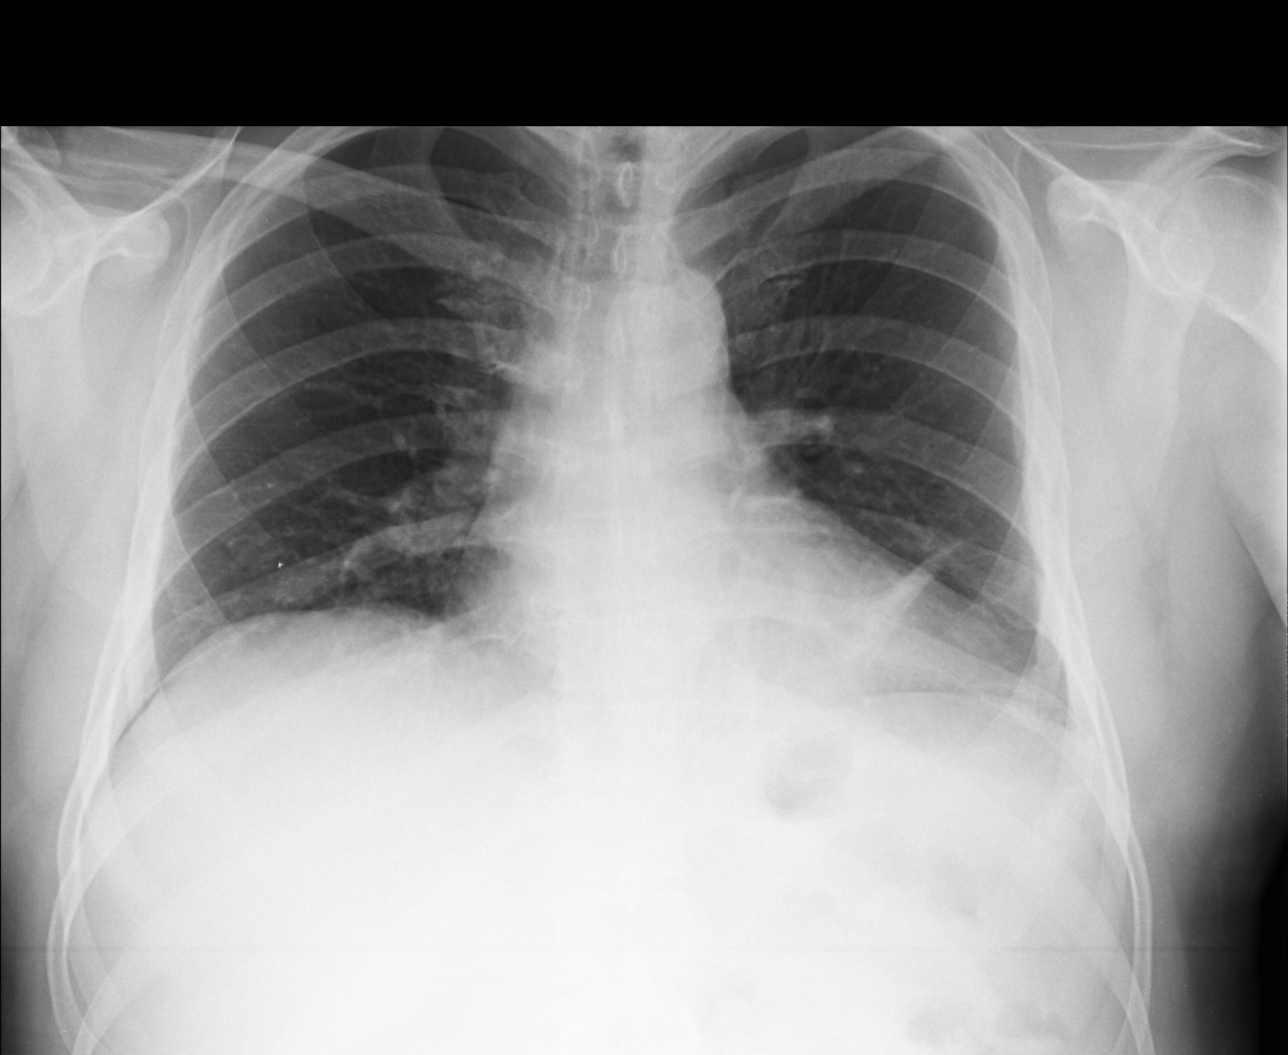

[AP]
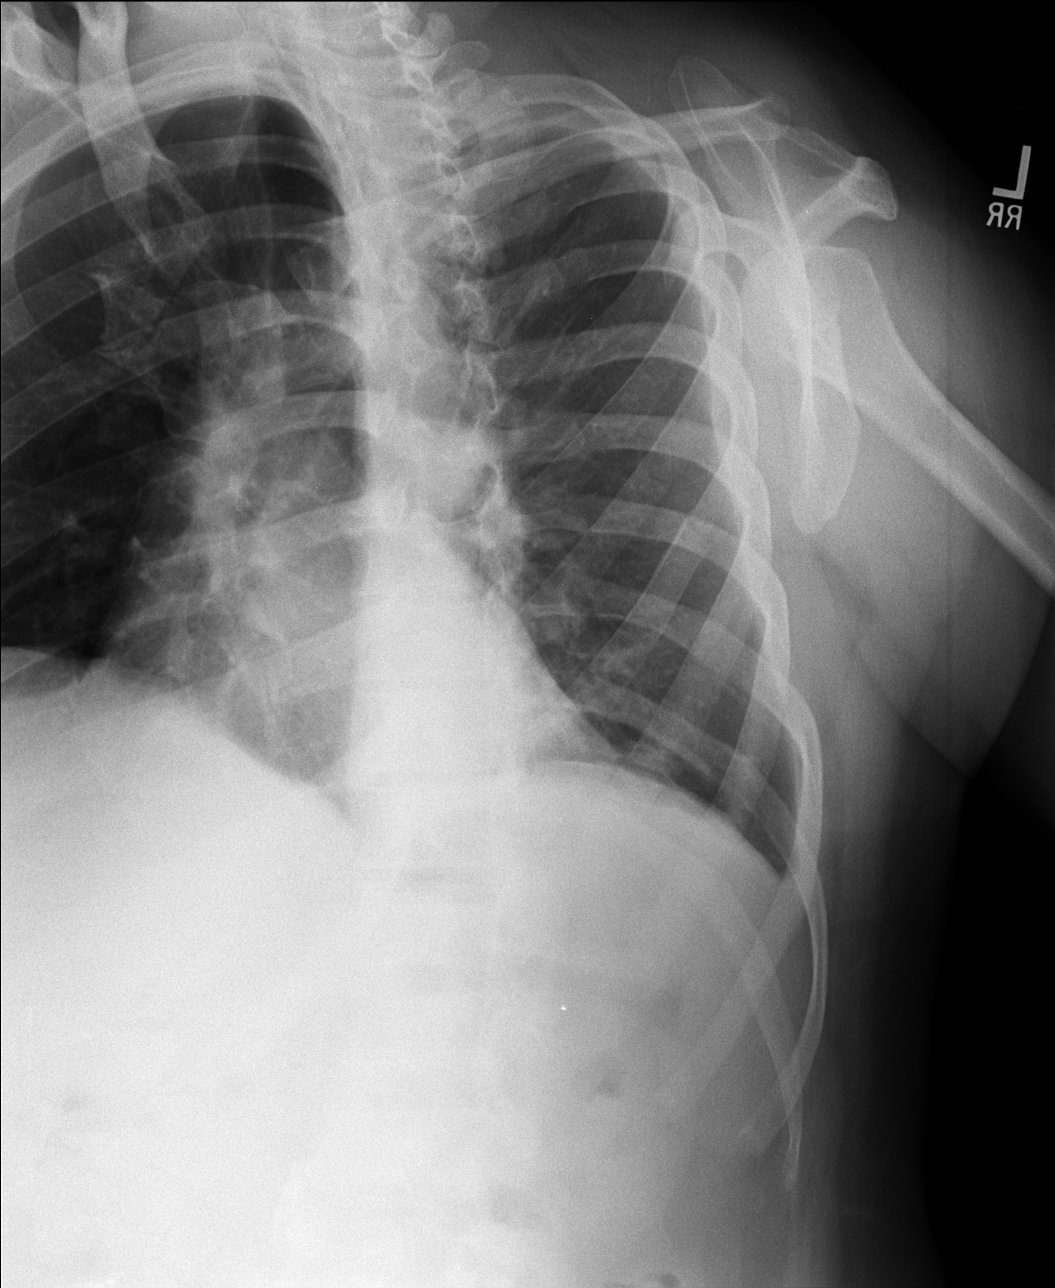

[rao]
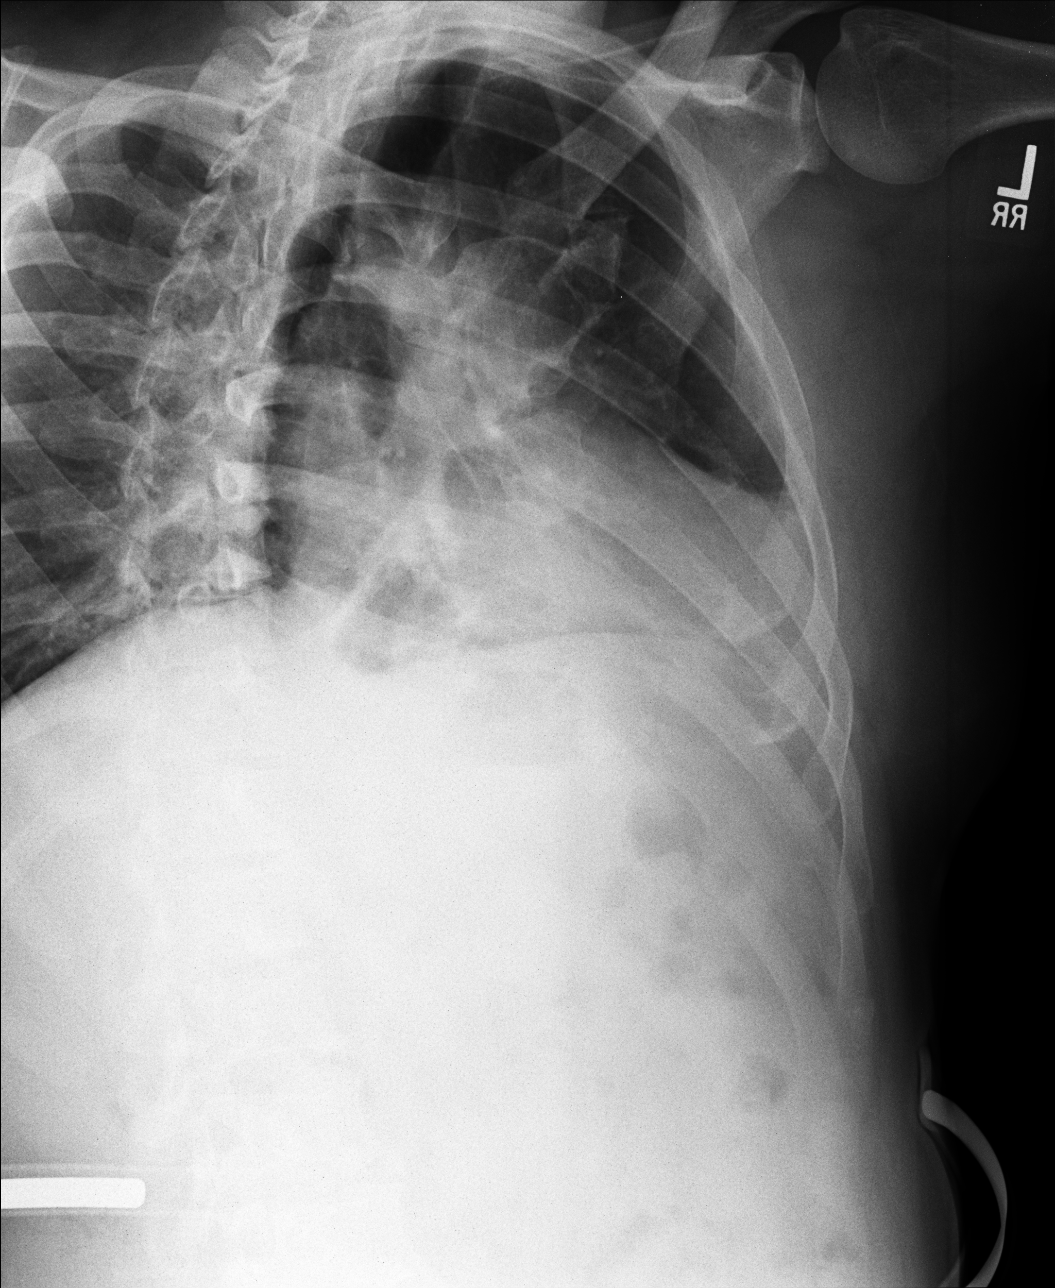

[3 of 3 positions shown; findings below may reference images not displayed]

FINDINGS: The lungs are mildly hypoinflated. There is subsegmental atelectasis
at the left lung base. There is no pneumothorax. A tiny left pleural
effusion is present. There are nondisplaced fractures of the
anterior aspects of the left fourth, fifth, and sixth ribs. There is
disruption of the left AC joint.
IMPRESSION: There are nondisplaced fractures of the anterior aspects of the left
fourth, fifth, and sixth ribs. There is left basilar atelectasis and
small left pleural effusion. There is disruption of the left AC
joint.

## 2015-12-09 IMAGING — CR DG AC JOINTS BILAT
4 series · 4 of 4 positions shown · non-contrast
Comparison: .  Chest x-ray and rib series of today's date

CLINICAL DATA: Status post fall from a 12 foot height striking the
left shoulder and left chest wall 6 days ago ; persistent left
shoulder pain

EXAM:
BILATERAL AC JOINTS

[AP (1 of 4)]
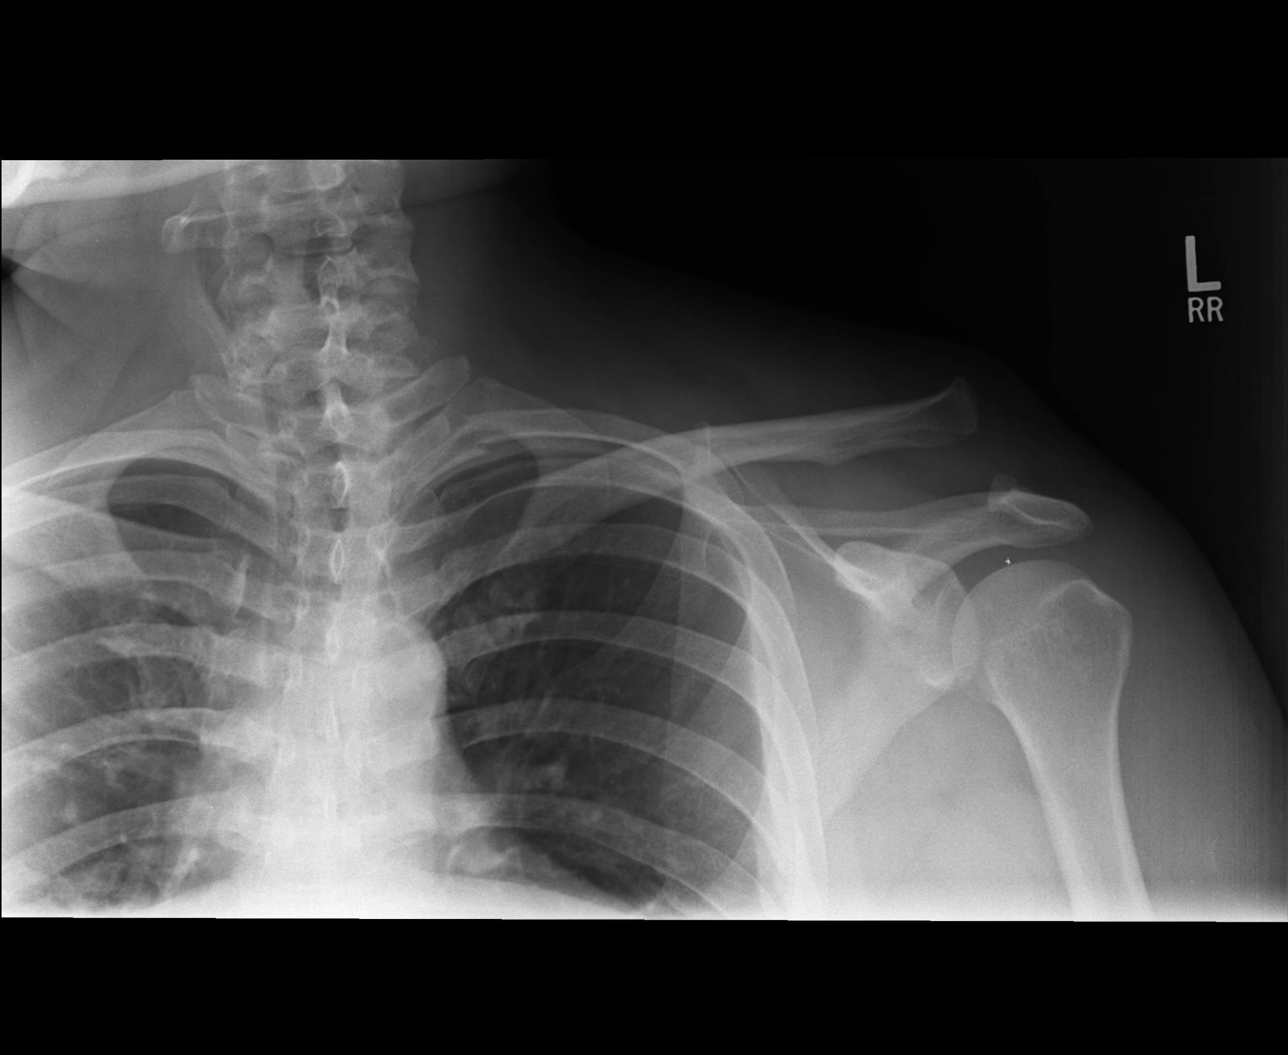

[AP (2 of 4)]
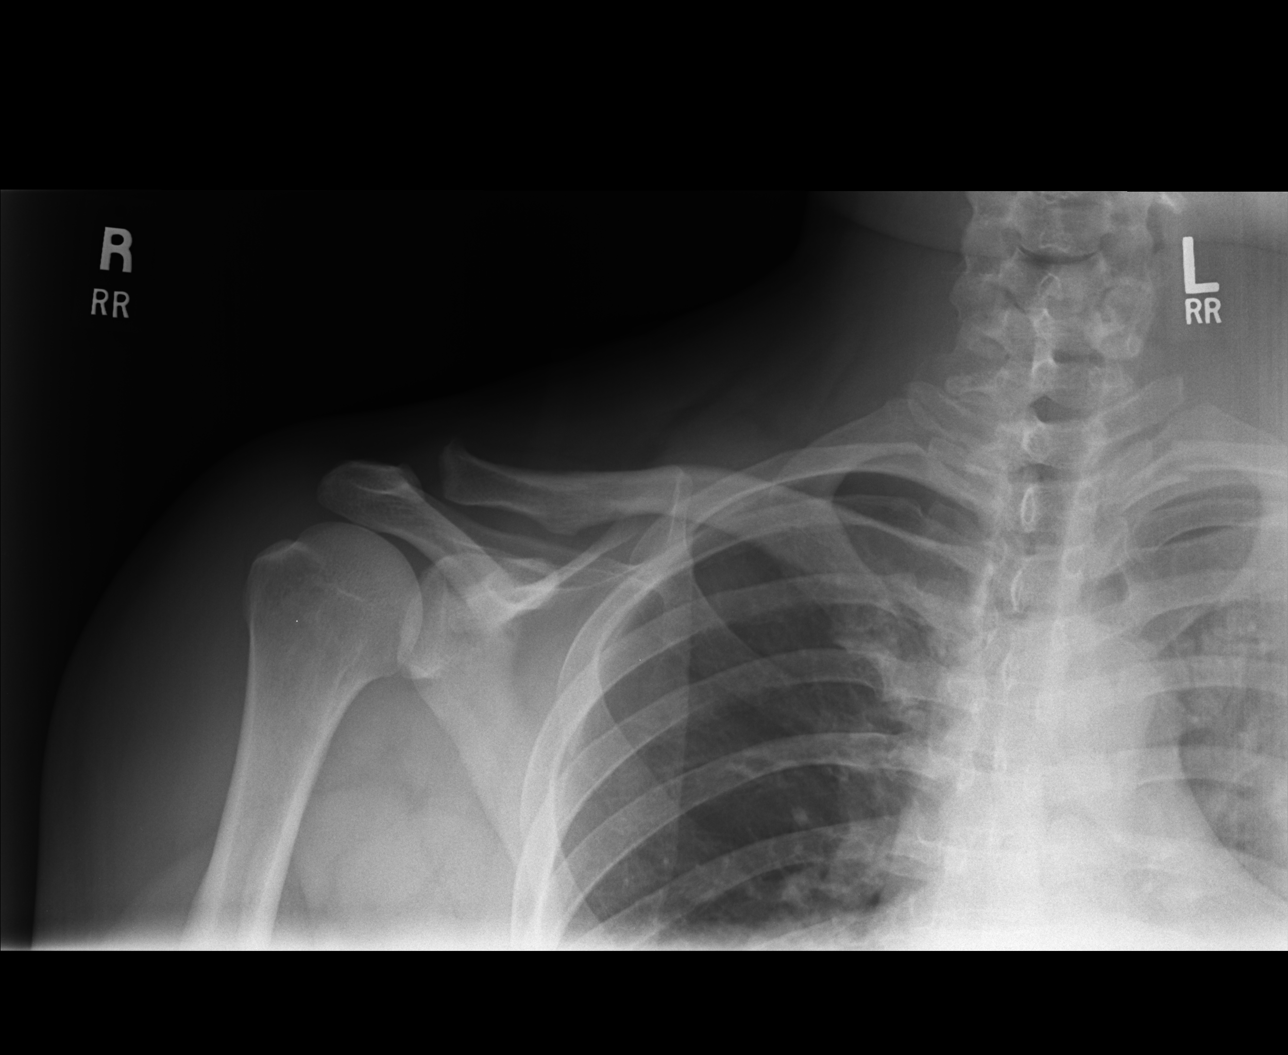

[AP (3 of 4)]
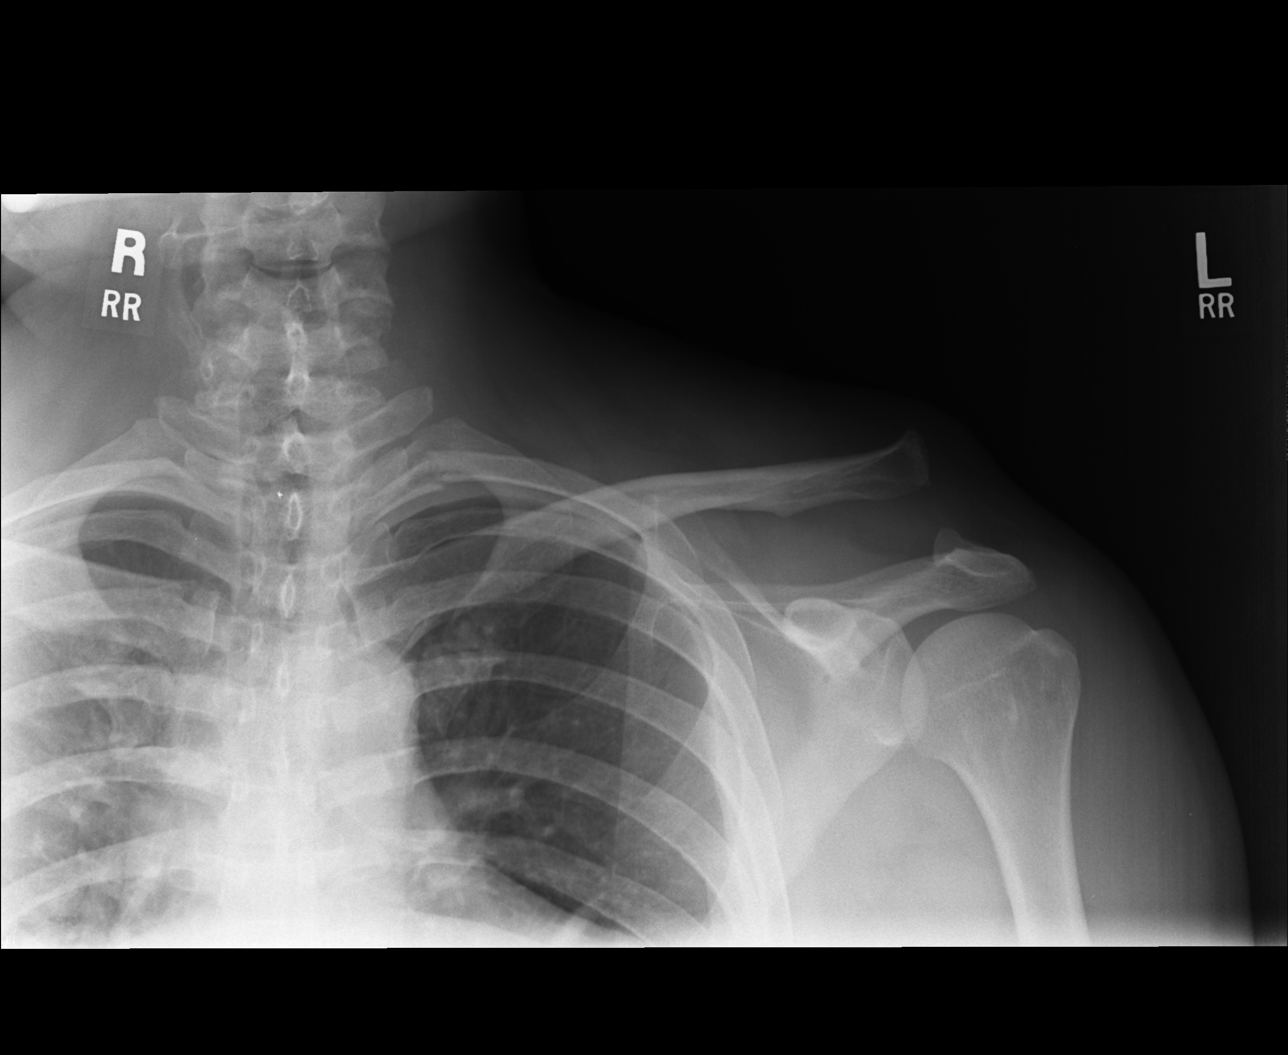

[AP (4 of 4)]
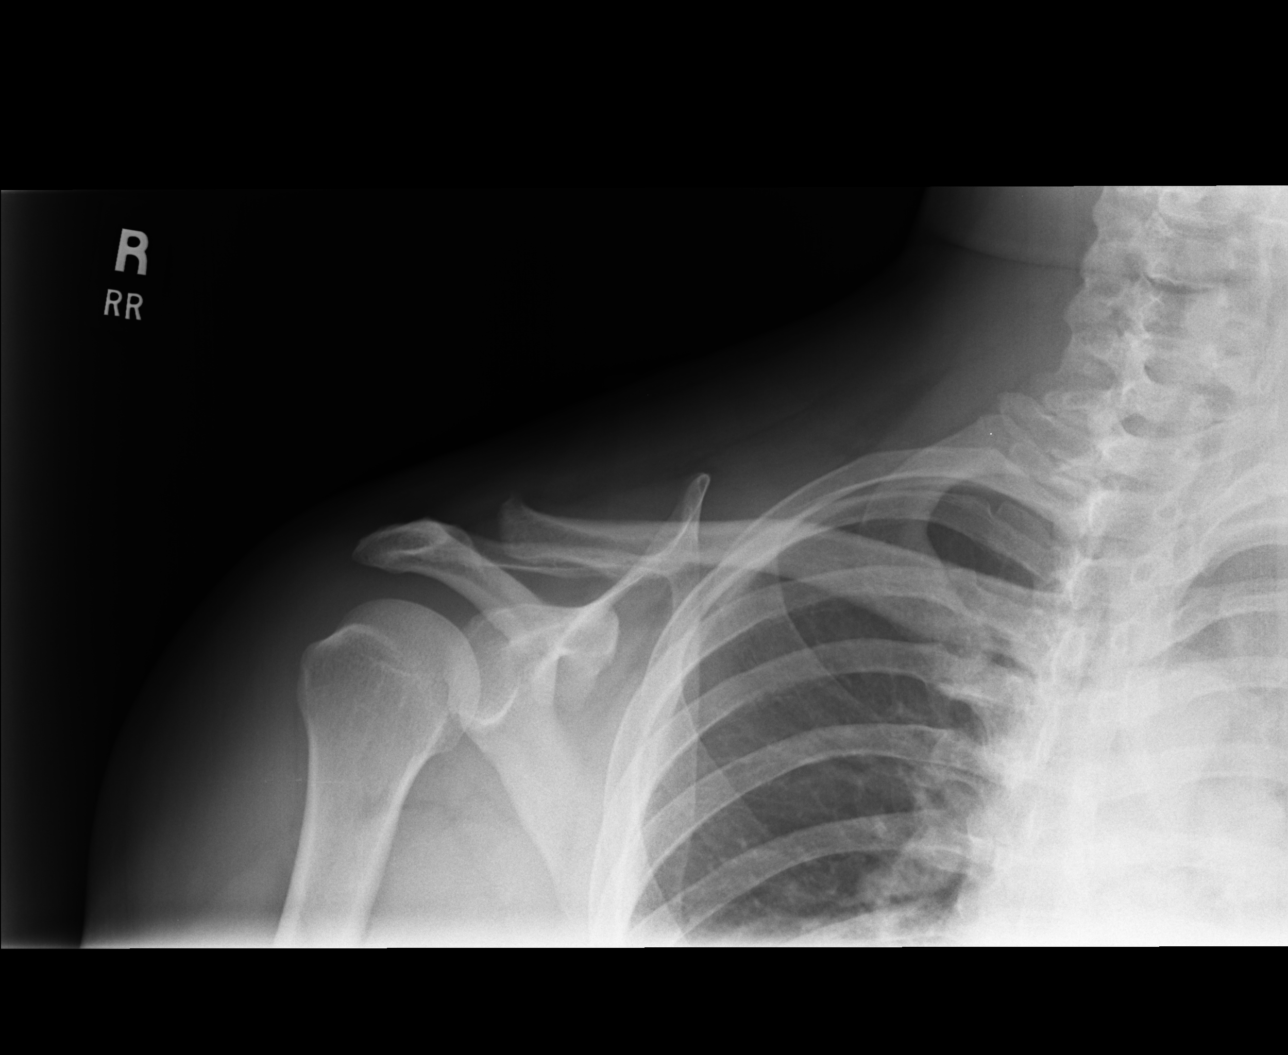

[4 of 4 positions shown; findings below may reference images not displayed]

FINDINGS: There is disruption of the left AC joint. The tip of the left
clavicle lies approximately 2 shaft widths up of the acromion. The
glenohumeral joint is unremarkable. No acute left clavicular
fracture is demonstrated.

The right clavicle is normal in appearance as is the right AC joint.
IMPRESSION: There is complete disruption of the left AC joint with a high-riding
clavicle with respect to the acromion.
